# Patient Record
Sex: Female | Born: 1995 | Race: White | Hispanic: No | Marital: Single | Smoking: Never smoker
Health system: Southern US, Community
[De-identification: ages and names within clinical notes are randomized; demographics above are authoritative.]

## PROBLEM LIST (undated history)

## (undated) DIAGNOSIS — Q410 Congenital absence, atresia and stenosis of duodenum: Secondary | ICD-10-CM

## (undated) DIAGNOSIS — C73 Malignant neoplasm of thyroid gland: Secondary | ICD-10-CM

## (undated) HISTORY — PX: THYROIDECTOMY: SHX17

## (undated) HISTORY — PX: DUODENAL ATRESIA REPAIR: SHX1477

---

## 2021-01-08 ENCOUNTER — Inpatient Hospital Stay (HOSPITAL_COMMUNITY): Payer: BC Managed Care – PPO

## 2021-01-08 ENCOUNTER — Encounter (HOSPITAL_COMMUNITY): Payer: Self-pay | Admitting: Obstetrics & Gynecology

## 2021-01-08 ENCOUNTER — Inpatient Hospital Stay (HOSPITAL_COMMUNITY)
Admission: AD | Admit: 2021-01-08 | Discharge: 2021-01-08 | Disposition: A | Discharge status: Home / Self Care | Payer: BC Managed Care – PPO | Attending: Obstetrics & Gynecology | Admitting: Obstetrics & Gynecology

## 2021-01-08 ENCOUNTER — Other Ambulatory Visit: Payer: Self-pay

## 2021-01-08 DIAGNOSIS — M549 Dorsalgia, unspecified: Secondary | ICD-10-CM | POA: Diagnosis not present

## 2021-01-08 DIAGNOSIS — O209 Hemorrhage in early pregnancy, unspecified: Secondary | ICD-10-CM | POA: Insufficient documentation

## 2021-01-08 DIAGNOSIS — Z3A01 Less than 8 weeks gestation of pregnancy: Secondary | ICD-10-CM | POA: Diagnosis not present

## 2021-01-08 DIAGNOSIS — M545 Low back pain, unspecified: Secondary | ICD-10-CM

## 2021-01-08 DIAGNOSIS — O26891 Other specified pregnancy related conditions, first trimester: Secondary | ICD-10-CM | POA: Insufficient documentation

## 2021-01-08 DIAGNOSIS — R109 Unspecified abdominal pain: Secondary | ICD-10-CM | POA: Insufficient documentation

## 2021-01-08 DIAGNOSIS — O99891 Other specified diseases and conditions complicating pregnancy: Secondary | ICD-10-CM

## 2021-01-08 DIAGNOSIS — O4690 Antepartum hemorrhage, unspecified, unspecified trimester: Secondary | ICD-10-CM

## 2021-01-08 HISTORY — DX: Congenital absence, atresia and stenosis of duodenum: Q41.0

## 2021-01-08 HISTORY — DX: Malignant neoplasm of thyroid gland: C73

## 2021-01-08 LAB — URINALYSIS, MICROSCOPIC (REFLEX)

## 2021-01-08 LAB — CBC
HCT: 41.5 % (ref 36.0–46.0)
Hemoglobin: 13.9 g/dL (ref 12.0–15.0)
MCH: 31.9 pg (ref 26.0–34.0)
MCHC: 33.5 g/dL (ref 30.0–36.0)
MCV: 95.2 fL (ref 80.0–100.0)
Platelets: 255 10*3/uL (ref 150–400)
RBC: 4.36 MIL/uL (ref 3.87–5.11)
RDW: 14.5 % (ref 11.5–15.5)
WBC: 5.8 10*3/uL (ref 4.0–10.5)
nRBC: 0 % (ref 0.0–0.2)

## 2021-01-08 LAB — ABO/RH: ABO/RH(D): A POS

## 2021-01-08 LAB — URINALYSIS, ROUTINE W REFLEX MICROSCOPIC
Bilirubin Urine: NEGATIVE
Glucose, UA: NEGATIVE mg/dL
Ketones, ur: NEGATIVE mg/dL
Leukocytes,Ua: NEGATIVE
Nitrite: NEGATIVE
Protein, ur: NEGATIVE mg/dL
Specific Gravity, Urine: <1.005 — ABNORMAL LOW (ref 1.005–1.030)
pH: 6 (ref 5.0–8.0)

## 2021-01-08 LAB — HCG, QUANTITATIVE, PREGNANCY: hCG, Beta Chain, Quant, S: 9882 m[IU]/mL — ABNORMAL HIGH (ref <5)

## 2021-01-08 LAB — WET PREP, GENITAL
Sperm: NONE SEEN
Trich, Wet Prep: NONE SEEN
Yeast Wet Prep HPF POC: NONE SEEN

## 2021-01-08 LAB — POCT PREGNANCY, URINE: Preg Test, Ur: POSITIVE — AB

## 2021-01-08 MED ORDER — METRONIDAZOLE 0.75 % VA GEL: 1.0000 | Freq: Every day | VAGINAL | 0 refills | Status: AC

## 2021-01-08 NOTE — MAU Note (Signed)
Positive pregnancy test 2 weeks .   Abd feels large x 1 week all of sudden when compared to 2 weeks ago.  Cramps on and off and worse in lower back then abd.  Vaginal bleeding x 1 week.  Darker blood and more often during day but not using many pads.

## 2021-01-08 NOTE — MAU Provider Note (Signed)
History     CSN: [id number]  Arrival date and time: 01/08/21 0009   Event Date/Time   First Provider Initiated Contact with Patient 01/08/21 0151      Chief Complaint  Patient presents with   Vaginal Bleeding   Theresa Thompson is a 25 y.o. G1P0 at 6.4 weeks by Unsure LMP of November 23, 2020.  She presents today for Vaginal Bleeding.  She states she discovered she was pregnant about 2 weeks ago and has been having bleeding since that time.  She states it was light initially and has gotten progressively worse.  She also reports some back and abdominal pain.  She reports the back pain worsened about 2-3 days ago. She states she has passed some clots with urination that she describes as "bigger than a pea, but smaller than a grape."  She has not tried any medication for her pain or discomfort. She rates the pain a 6/10 with standing and reports it is worsened with laying down.  Patient states that the pain is improved with drawing up her knees, but that it is at it's worse when her legs are flat out. She denies sex in the past 3 days.    OB History     Gravida  1   Para      Term      Preterm      AB      Living         SAB      IAB      Ectopic      Multiple      Live Births              Past Medical History:  Diagnosis Date   Duodenal atresia    Thyroid cancer ([place])     Past Surgical History:  Procedure Laterality Date   DUODENAL ATRESIA REPAIR     THYROIDECTOMY      Family History  Problem Relation Age of Onset   Cancer Mother    Ovarian cancer Mother    Colon cancer Mother     Social History   Tobacco Use   Smoking status: Never   Smokeless tobacco: Never   Tobacco comments:    CBD PEN OCCAS USE FOR SLEEP  Vaping Use   Vaping Use: Some days   Substances: Nicotine  Substance Use Topics   Alcohol use: Not Currently   Drug use: Never    Allergies: No Known Allergies  Medications Prior to Admission  Medication Sig Dispense Refill Last  Dose   levothyroxine (SYNTHROID) 125 MCG tablet Take 125 mcg by mouth daily before breakfast.   01/07/2021    Review of Systems  Gastrointestinal:  Positive for abdominal pain and constipation. Negative for diarrhea, nausea and vomiting. Anal bleeding: Hard to pass, Last BM yesterday. Genitourinary:  Positive for vaginal bleeding and vaginal discharge (Prior to bleeding-white without odor.). Negative for difficulty urinating and dysuria.  Musculoskeletal:  Positive for back pain.  Neurological:  Negative for dizziness, light-headedness and headaches.  Physical Exam   Blood pressure 112/71, pulse 96, temperature 98.4 F (36.9 C), temperature source Oral, resp. rate 16, height '5\' 6"'$  (1.676 m), weight 92.1 kg, last menstrual period 11/24/2020, SpO2 96 %.  Physical Exam Vitals reviewed. Exam conducted with a chaperone present.  Constitutional:      Appearance: Normal appearance.  HENT:     Head: Normocephalic and atraumatic.  Eyes:     Conjunctiva/sclera: Conjunctivae normal.  Cardiovascular:  Rate and Rhythm: Normal rate and regular rhythm.  Abdominal:     General: Bowel sounds are normal.  Genitourinary:    Vagina: Vaginal discharge present. No tenderness.     Cervix: No cervical motion tenderness or discharge.     Uterus: Not enlarged and not tender.      Comments: Speculum Exam: -Normal External Genitalia: Non tender, no apparent discharge at introitus.  -Vaginal Vault: Pink mucosa with good rugae. Scant amt thin white malodorous discharge -wet prep collected -Cervix:Pink, no lesions, cysts, or polyps.  Appears closed. No active bleeding from os-GC/CT collected -Bimanual Exam:  Uterine size difficult to assess d/t body habitus.     Musculoskeletal:     Cervical back: Normal range of motion.  Skin:    General: Skin is warm and dry.  Neurological:     Mental Status: She is alert and oriented to person, place, and time.  Psychiatric:        Mood and Affect: Mood normal.         Behavior: Behavior normal.        Thought Content: Thought content normal.    MAU Course  Procedures Results for orders placed or performed during the hospital encounter of 01/08/21 (from the past 24 hour(s))  Urinalysis, Routine w reflex microscopic Urine, Clean Catch     Status: Abnormal   Collection Time: 01/08/21 12:31 AM  Result Value Ref Range   Color, Urine YELLOW YELLOW   APPearance CLEAR CLEAR   Specific Gravity, Urine <1.005 (L) 1.005 - 1.030   pH 6.0 5.0 - 8.0   Glucose, UA NEGATIVE NEGATIVE mg/dL   Hgb urine dipstick TRACE (A) NEGATIVE   Bilirubin Urine NEGATIVE NEGATIVE   Ketones, ur NEGATIVE NEGATIVE mg/dL   Protein, ur NEGATIVE NEGATIVE mg/dL   Nitrite NEGATIVE NEGATIVE   Leukocytes,Ua NEGATIVE NEGATIVE  Urinalysis, Microscopic (reflex)     Status: Abnormal   Collection Time: 01/08/21 12:31 AM  Result Value Ref Range   RBC / HPF 0-5 0 - 5 RBC/hpf   WBC, UA 0-5 0 - 5 WBC/hpf   Bacteria, UA RARE (A) NONE SEEN   Squamous Epithelial / LPF 0-5 0 - 5  Pregnancy, urine POC     Status: Abnormal   Collection Time: 01/08/21 12:34 AM  Result Value Ref Range   Preg Test, Ur POSITIVE (A) NEGATIVE  CBC     Status: None   Collection Time: 01/08/21  1:11 AM  Result Value Ref Range   WBC 5.8 4.0 - 10.5 K/uL   RBC 4.36 3.87 - 5.11 MIL/uL   Hemoglobin 13.9 12.0 - 15.0 g/dL   HCT 41.5 36.0 - 46.0 %   MCV 95.2 80.0 - 100.0 fL   MCH 31.9 26.0 - 34.0 pg   MCHC 33.5 30.0 - 36.0 g/dL   RDW 14.5 11.5 - 15.5 %   Platelets 255 150 - 400 K/uL   nRBC 0.0 0.0 - 0.2 %  hCG, quantitative, pregnancy     Status: Abnormal   Collection Time: 01/08/21  1:11 AM  Result Value Ref Range   hCG, Beta Chain, Quant, S 9,882 (H) <5 mIU/mL  ABO/Rh     Status: None   Collection Time: 01/08/21  1:11 AM  Result Value Ref Range   ABO/RH(D) A POS    No rh immune globuloin      NOT A RH IMMUNE GLOBULIN CANDIDATE, PT RH POSITIVE Performed at [facility], [address],  [place] [address]   Wet prep, genital     Status: Abnormal   Collection Time: 01/08/21  2:05 AM  Result Value Ref Range   Yeast Wet Prep HPF POC NONE SEEN NONE SEEN   Trich, Wet Prep NONE SEEN NONE SEEN   Clue Cells Wet Prep HPF POC PRESENT (A) NONE SEEN   WBC, Wet Prep HPF POC FEW (A) NONE SEEN   Sperm NONE SEEN    US OB LESS THAN 14 WEEKS WITH OB TRANSVAGINAL  Result Date: 01/08/2021 CLINICAL DATA:  Pregnant, spotting, cramping, beta HCG 9882 EXAM: OBSTETRIC <14 WK [place] AND TRANSVAGINAL OB US TECHNIQUE: Both transabdominal and transvaginal ultrasound examinations were performed for complete evaluation of the gestation as well as the maternal uterus, adnexal regions, and pelvic cul-de-sac. Transvaginal technique was performed to assess early pregnancy. COMPARISON:  None. FINDINGS: Intrauterine gestational sac: Single Yolk sac:  Visualized. Embryo:  Not Visualized. MSD: 6.6 mm   5 w   2 d Subchorionic hemorrhage:  None visualized. Maternal uterus/adnexae: Bilateral ovaries are within normal limits. No pelvic ascites. IMPRESSION: Single intrauterine gestational sac with yolk sac, measuring 5 weeks 2 days by mean sac diameter. No fetal pole is visualized, likely due to early gestation. Consider follow-up pelvic ultrasound in 14 days as clinically warranted. Electronically Signed   By: Julian Hy M.D.   On: 01/08/2021 02:44     MDM Pelvic Exam; Wet Prep and GC/CT Labs: UA, UPT, CBC, hCG, ABO Ultrasound Assessment and Plan  25 year old G1P0 at 6.4 weeks Abdominal Pain Back Pain Vaginal Bleeding  -POC Reviewed -Exam Performed and findings discussed.  -Cultures collected and pending. -Labs ordered. -Will send for [place] and await results.  Maryann Conners 01/08/2021, 1:51 AM   Reassessment (3:16 AM) IUGS + YS Bacterial Vaginosis  -Results as above. -Provider to bedside to discuss. -Patient informed of BV diagnosis and treatment. -Rx sent to pharmacy on file.  -Discussed US findings and  need for follow up. -Patient states she has obgyn in [place].  INstructed to call and get [place] scheduled accordingly. -Precautions given. -Encouraged to call primary office or return to MAU if symptoms worsen or with the onset of new symptoms. -Discharged to home in stable condition.  Maryann Conners MSN, CNM Advanced Practice Provider, Center for [employer] [facility]

## 2021-01-09 LAB — GC/CHLAMYDIA PROBE AMP (~~LOC~~) NOT AT ARMC
Chlamydia: POSITIVE — AB
Comment: NEGATIVE
Comment: NORMAL
Neisseria Gonorrhea: NEGATIVE

## 2021-01-11 ENCOUNTER — Telehealth: Payer: Self-pay

## 2021-01-11 NOTE — Telephone Encounter (Signed)
Theresa Thompson June 24, 1995  Attempted to contact patient. No answer.  HIPPA complaint message left and patient instructed to return call to MAU by [place] MSN, [place] Provider, Center for [facility]

## 2021-01-12 ENCOUNTER — Other Ambulatory Visit: Payer: Self-pay

## 2021-01-12 DIAGNOSIS — A749 Chlamydial infection, unspecified: Secondary | ICD-10-CM

## 2021-01-12 MED ORDER — AZITHROMYCIN 500 MG PO TABS: 1000.0000 mg | ORAL_TABLET | Freq: Once | ORAL | 0 refills | Status: AC

## 2022-07-23 ENCOUNTER — Other Ambulatory Visit: Payer: Self-pay

## 2022-07-23 ENCOUNTER — Emergency Department (HOSPITAL_BASED_OUTPATIENT_CLINIC_OR_DEPARTMENT_OTHER): Payer: Medicaid Other

## 2022-07-23 ENCOUNTER — Encounter (HOSPITAL_BASED_OUTPATIENT_CLINIC_OR_DEPARTMENT_OTHER): Payer: Self-pay | Admitting: *Deleted

## 2022-07-23 ENCOUNTER — Emergency Department (HOSPITAL_BASED_OUTPATIENT_CLINIC_OR_DEPARTMENT_OTHER)
Admission: EM | Admit: 2022-07-23 | Discharge: 2022-07-24 | Disposition: A | Discharge status: Home / Self Care | Payer: Medicaid Other | Attending: Emergency Medicine | Admitting: Emergency Medicine

## 2022-07-23 DIAGNOSIS — Z85850 Personal history of malignant neoplasm of thyroid: Secondary | ICD-10-CM | POA: Insufficient documentation

## 2022-07-23 DIAGNOSIS — N179 Acute kidney failure, unspecified: Secondary | ICD-10-CM

## 2022-07-23 DIAGNOSIS — K219 Gastro-esophageal reflux disease without esophagitis: Secondary | ICD-10-CM

## 2022-07-23 DIAGNOSIS — R079 Chest pain, unspecified: Secondary | ICD-10-CM | POA: Diagnosis present

## 2022-07-23 LAB — LIPASE, BLOOD: Lipase: 44 U/L (ref 11–51)

## 2022-07-23 LAB — HEPATIC FUNCTION PANEL
ALT: 23 U/L (ref 0–44)
AST: 37 U/L (ref 15–41)
Albumin: 4.2 g/dL (ref 3.5–5.0)
Alkaline Phosphatase: 80 U/L (ref 38–126)
Bilirubin, Direct: 0.3 mg/dL — ABNORMAL HIGH (ref 0.0–0.2)
Indirect Bilirubin: 0.6 mg/dL (ref 0.3–0.9)
Total Bilirubin: 0.9 mg/dL (ref 0.3–1.2)
Total Protein: 8.3 g/dL — ABNORMAL HIGH (ref 6.5–8.1)

## 2022-07-23 LAB — CBC
HCT: 41 % (ref 36.0–46.0)
Hemoglobin: 13.8 g/dL (ref 12.0–15.0)
MCH: 33.6 pg (ref 26.0–34.0)
MCHC: 33.7 g/dL (ref 30.0–36.0)
MCV: 99.8 fL (ref 80.0–100.0)
Platelets: 225 10*3/uL (ref 150–400)
RBC: 4.11 MIL/uL (ref 3.87–5.11)
RDW: 14.8 % (ref 11.5–15.5)
WBC: 6.4 10*3/uL (ref 4.0–10.5)
nRBC: 0 % (ref 0.0–0.2)

## 2022-07-23 LAB — BASIC METABOLIC PANEL
Anion gap: 10 (ref 5–15)
BUN: 13 mg/dL (ref 6–20)
CO2: 23 mmol/L (ref 22–32)
Calcium: 8.2 mg/dL — ABNORMAL LOW (ref 8.9–10.3)
Chloride: 103 mmol/L (ref 98–111)
Creatinine, Ser: 1.31 mg/dL — ABNORMAL HIGH (ref 0.44–1.00)
GFR, Estimated: 58 mL/min — ABNORMAL LOW (ref >60)
Glucose, Bld: 84 mg/dL (ref 70–99)
Potassium: 3.9 mmol/L (ref 3.5–5.1)
Sodium: 136 mmol/L (ref 135–145)

## 2022-07-23 LAB — URINALYSIS, ROUTINE W REFLEX MICROSCOPIC
Bilirubin Urine: NEGATIVE
Glucose, UA: NEGATIVE mg/dL
Hgb urine dipstick: NEGATIVE
Ketones, ur: NEGATIVE mg/dL
Leukocytes,Ua: NEGATIVE
Nitrite: NEGATIVE
Protein, ur: NEGATIVE mg/dL
Specific Gravity, Urine: 1.01 (ref 1.005–1.030)
pH: 5.5 (ref 5.0–8.0)

## 2022-07-23 LAB — TROPONIN I (HIGH SENSITIVITY)
Troponin I (High Sensitivity): <2 ng/L (ref <18)
Troponin I (High Sensitivity): <2 ng/L (ref <18)

## 2022-07-23 LAB — PREGNANCY, URINE: Preg Test, Ur: NEGATIVE

## 2022-07-23 MED ORDER — LACTATED RINGERS IV BOLUS
1000.0000 mL | Freq: Once | INTRAVENOUS | Status: AC
  Administered 2022-07-23: 1000 mL via INTRAVENOUS

## 2022-07-23 NOTE — ED Triage Notes (Addendum)
Pt has been having mid to upper back pain as well as crushing chest pain which increases with swallowing as well as after eating and she sometimes has a difficult time swallowing food.  Pt reports a severe indigestion like pain. Pt reports that she has also has had right shoulder pain.  Pt reports that this has been going on for 10 months since she gave birth but today the symptoms were so bad that she could not stand it.  No SOB with this.

## 2022-07-23 NOTE — ED Notes (Signed)
Patient transported to X-ray 

## 2022-07-24 MED ORDER — SUCRALFATE 1 G PO TABS: 1.0000 g | ORAL_TABLET | Freq: Three times a day (TID) | ORAL | 0 refills | Status: AC

## 2022-07-24 NOTE — ED Provider Notes (Incomplete)
[place] EMERGENCY DE[facility]MENT [facility] [place] Provider Note   CSN: [id number] Arrival date & time: 07/23/22  2126     History Chief Complaint  Patient presents with  . Back Pain  . Chest Pain    Theresa Thompson is a 27 y.o. female with h/o duodenal atresia and thyroid cancer s/p thyroidectomy presents to the ER for evaluation of chest pain radiating to the upper back for the past 5 months. The patient reports that this happens after eating foods and fluids, but does happen without.   Back Pain Associated symptoms: chest pain   Chest Pain Associated symptoms: back pain        Home Medications Prior to Admission medications   Medication Sig Start Date End Date Taking? Authorizing Provider  levothyroxine (SYNTHROID) 125 MCG tablet Take 125 mcg by mouth daily before breakfast.    [provider]  metroNIDAZOLE (METROGEL VAGINAL) 0.75 % vaginal gel Place 1 Applicatorful vaginally at bedtime. Insert one applicator, at bedtime, for 5 nights. 01/08/21   Gavin Pound, CNM      Allergies    Patient has no known allergies.    Review of Systems   Review of Systems  Cardiovascular:  Positive for chest pain.  Musculoskeletal:  Positive for back pain.    Physical Exam Updated Vital Signs BP (!) 92/58   Pulse 76   Temp 97.7 F (36.5 C) (Oral)   Resp (!) 23   Wt 92.1 kg   LMP 06/27/2022 (Approximate)   SpO2 98%   Breastfeeding No   BMI 32.77 kg/m  Physical Exam  ED Results / Procedures / Treatments   Labs (all labs ordered are listed, but only abnormal results are displayed) Labs Reviewed  BASIC METABOLIC PANEL - Abnormal; Notable for the following components:      Result Value   Creatinine, Ser 1.31 (*)    Calcium 8.2 (*)    GFR, Estimated 58 (*)    All other components within normal limits  HEPATIC FUNCTION PANEL - Abnormal; Notable for the following components:   Total Protein 8.3 (*)    Bilirubin, Direct 0.3 (*)    All other components  within normal limits  CBC  PREGNANCY, URINE  LIPASE, BLOOD  URINALYSIS, ROUTINE W REFLEX MICROSCOPIC  TROPONIN I (HIGH SENSITIVITY)  TROPONIN I (HIGH SENSITIVITY)    EKG None  Radiology DG Chest 2 View  Result Date: 07/23/2022 CLINICAL DATA:  Upper back and chest pain.  Right shoulder pain. EXAM: CHEST - 2 VIEW COMPARISON:  11/12/2017. FINDINGS: The heart size and mediastinal contours are within normal limits. Both lungs are clear. No acute osseous abnormality. IMPRESSION: No active cardiopulmonary disease. Electronically Signed   By: Brett Fairy M.D.   On: 07/23/2022 21:59    Procedures Procedures   Medications Ordered in ED Medications  lactated ringers bolus 1,000 mL (1,000 mLs Intravenous New Bag/Given 07/23/22 2319)    ED Course/ Medical Decision Making/ A&P                           Medical Decision Making Amount and/or Complexity of Data Reviewed Labs: ordered. Radiology: ordered.   ***  27 y.o. female presents to the ER for evaluation of ACS, pericarditis, myocarditis, aortic dissection, PE, pneumothorax, esophageal spasm or rupture, chronic angina, pneumonia, bronchitis, GERD, reflux/PUD, biliary disease, pancreatitis, costochondritis, anxiety, esophageal stricture. Differential diagnosis includes but is not limited to ***. Vital signs ***. Physical exam as  noted above.   I independently reviewed and interpreted the patient's labs. ***.  After consideration of the diagnostic results and the patients response to treatment, I feel that *** .   Emergency department workup does*** not suggest an emergent condition requiring admission or immediate intervention beyond what has been performed at this time.   ***We discussed the results of the labs/imaging. The plan is ***. We discussed strict return precautions and red flag symptoms. The patient verbalized their understanding and agrees to the plan. The patient is stable and being discharged home in good  condition.  ***Portions of this report may have been transcribed using voice recognition software. Every effort was made to ensure accuracy; however, inadvertent computerized transcription errors may be present.   Final Clinical Impression(s) / ED Diagnoses Final diagnoses:  None    Rx / DC Orders ED Discharge Orders     None

## 2022-07-24 NOTE — ED Provider Notes (Signed)
[place] EMERGENCY DEPARTMENT AT [place] Provider Note   CSN: [id number] Arrival date & time: 07/23/22  2126     History Chief Complaint  Patient presents with   Back Pain   Chest Pain    Theresa Thompson is a 27 y.o. female with h/o duodenal atresia and thyroid cancer s/p thyroidectomy presents to the ER for evaluation of chest pain radiating to the upper back for the past 5 months. The patient reports that this happens after eating foods and fluids, but does happen without.   Back Pain Associated symptoms: chest pain   Chest Pain Associated symptoms: back pain        Home Medications Prior to Admission medications   Medication Sig Start Date End Date Taking? Authorizing Provider  levothyroxine (SYNTHROID) 125 MCG tablet Take 125 mcg by mouth daily before breakfast.    [provider]  metroNIDAZOLE (METROGEL VAGINAL) 0.75 % vaginal gel Place 1 Applicatorful vaginally at bedtime. Insert one applicator, at bedtime, for 5 nights. 01/08/21   Gavin Pound, CNM      Allergies    Patient has no known allergies.    Review of Systems   Review of Systems  Cardiovascular:  Positive for chest pain.  Musculoskeletal:  Positive for back pain.    Physical Exam Updated Vital Signs BP (!) 92/58   Pulse 76   Temp 97.7 F (36.5 C) (Oral)   Resp (!) 23   Wt 92.1 kg   LMP 06/27/2022 (Approximate)   SpO2 98%   Breastfeeding No   BMI 32.77 kg/m  Physical Exam  ED Results / Procedures / Treatments   Labs (all labs ordered are listed, but only abnormal results are displayed) Labs Reviewed  BASIC METABOLIC PANEL - Abnormal; Notable for the following components:      Result Value   Creatinine, Ser 1.31 (*)    Calcium 8.2 (*)    GFR, Estimated 58 (*)    All other components within normal limits  HEPATIC FUNCTION PANEL - Abnormal; Notable for the following components:   Total Protein 8.3 (*)    Bilirubin, Direct 0.3 (*)    All other components  within normal limits  CBC  PREGNANCY, URINE  LIPASE, BLOOD  URINALYSIS, ROUTINE W REFLEX MICROSCOPIC  TROPONIN I (HIGH SENSITIVITY)  TROPONIN I (HIGH SENSITIVITY)    EKG None  Radiology DG Chest 2 View  Result Date: 07/23/2022 CLINICAL DATA:  Upper back and chest pain.  Right shoulder pain. EXAM: CHEST - 2 VIEW COMPARISON:  11/12/2017. FINDINGS: The heart size and mediastinal contours are within normal limits. Both lungs are clear. No acute osseous abnormality. IMPRESSION: No active cardiopulmonary disease. Electronically Signed   By: Brett Fairy M.D.   On: 07/23/2022 21:59    Procedures Procedures   Medications Ordered in ED Medications  lactated ringers bolus 1,000 mL (1,000 mLs Intravenous New Bag/Given 07/23/22 2319)    ED Course/ Medical Decision Making/ A&P                           Medical Decision Making Amount and/or Complexity of Data Reviewed Labs: ordered. Radiology: ordered.   ***  27 y.o. female presents to the ER for evaluation of ACS, pericarditis, myocarditis, aortic dissection, PE, pneumothorax, esophageal spasm or rupture, chronic angina, pneumonia, bronchitis, GERD, reflux/PUD, biliary disease, pancreatitis, costochondritis, anxiety, esophageal stricture. Differential diagnosis includes but is not limited to ***. Vital signs ***. Physical exam as  noted above.   I independently reviewed and interpreted the patient's labs. ***.  After consideration of the diagnostic results and the patients response to treatment, I feel that *** .   Emergency department workup does*** not suggest an emergent condition requiring admission or immediate intervention beyond what has been performed at this time.   ***We discussed the results of the labs/imaging. The plan is ***. We discussed strict return precautions and red flag symptoms. The patient verbalized their understanding and agrees to the plan. The patient is stable and being discharged home in good  condition.  ***Portions of this report may have been transcribed using voice recognition software. Every effort was made to ensure accuracy; however, inadvertent computerized transcription errors may be present.   Final Clinical Impression(s) / ED Diagnoses Final diagnoses:  None    Rx / DC Orders ED Discharge Orders     None

## 2022-07-24 NOTE — Discharge Instructions (Addendum)
You were seen in the ER today for evaluation of your acid reflux. I am starting you on a medication called Carafate. You will need to take this three times a day with meals. I have included the information for Dr. Lorie Apley office. Please make sure you call them tomorrow to schedule appointment. You will likely need additional imaging. Please make sure you are drinking fluids because your kidney function was elevated. If you have any concerns, new or worsening symptoms, please return to the nearest ER for re-evaluation.   Contact a health care provider if: You have: New symptoms. Unexplained weight loss. Difficulty swallowing or it hurts to swallow. Wheezing or a persistent cough. A hoarse voice. Your symptoms do not improve with treatment. Get help right away if: You have sudden pain in your arms, neck, jaw, teeth, or back. You suddenly feel sweaty, dizzy, or light-headed. You have chest pain or shortness of breath. You vomit and the vomit is green, yellow, or black, or it looks like blood or coffee grounds. You faint. You have stool that is red, bloody, or black. You cannot swallow, drink, or eat. These symptoms may represent a serious problem that is an emergency. Do not wait to see if the symptoms will go away. Get medical help right away. Call your local emergency services (911 in the [place] Do not drive yourself to the hospital.

## 2022-10-27 IMAGING — US US OB < 14 WEEKS - US OB TV
1 series · 15 of 28 positions shown · non-contrast
Comparison: None.

CLINICAL DATA: Pregnant, spotting, cramping, beta HCG 1005

EXAM:
OBSTETRIC <14 WK US AND TRANSVAGINAL OB US
TECHNIQUE: Both transabdominal and transvaginal ultrasound examinations were
performed for complete evaluation of the gestation as well as the
maternal uterus, adnexal regions, and pelvic cul-de-sac.
Transvaginal technique was performed to assess early pregnancy.

[Series 1: us ob < 14 weeks - us ob tv · 15 of 33 slices shown]
[im 1/33]
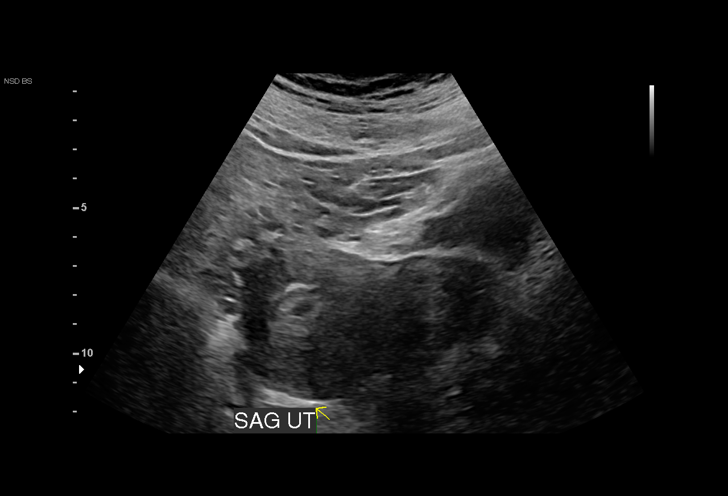
[im 3/33]
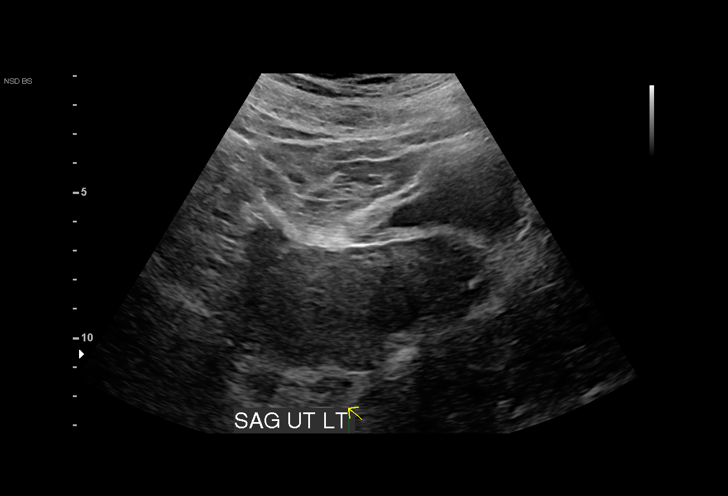
[im 5/33]
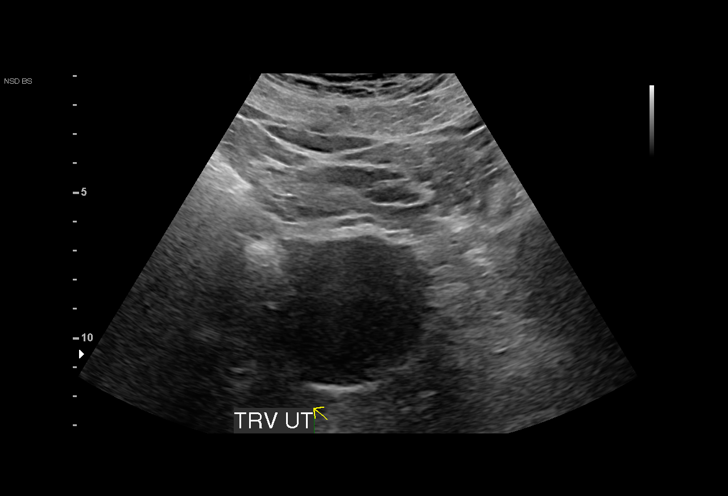
[im 8/33]
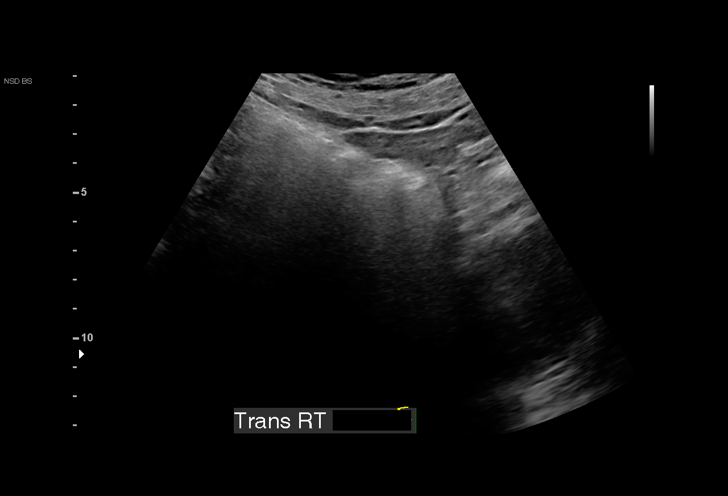
[im 10/33]
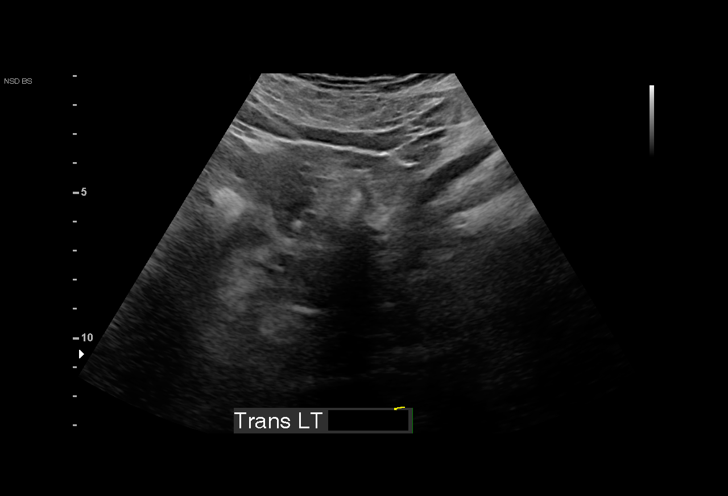
[im 12/33]
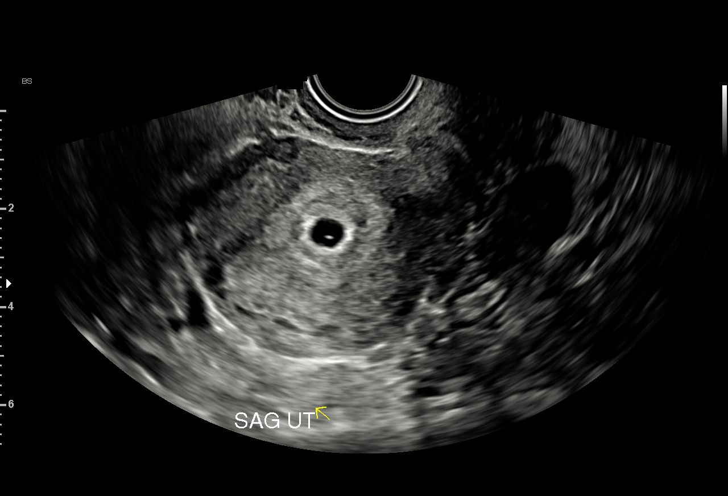
[im 15/33]
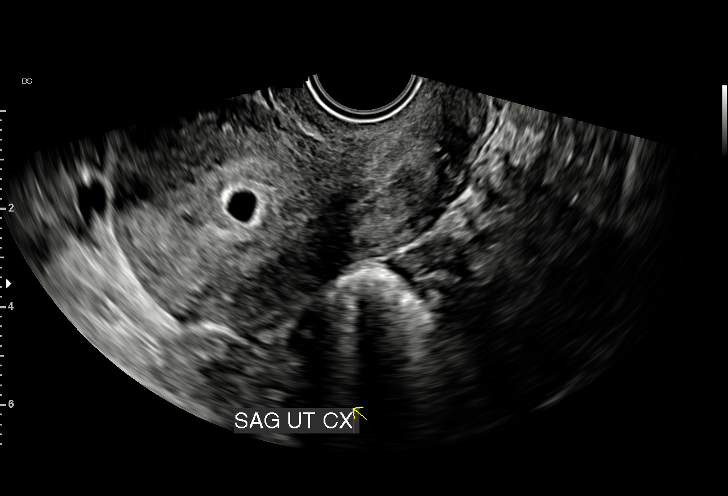
[im 17/33]
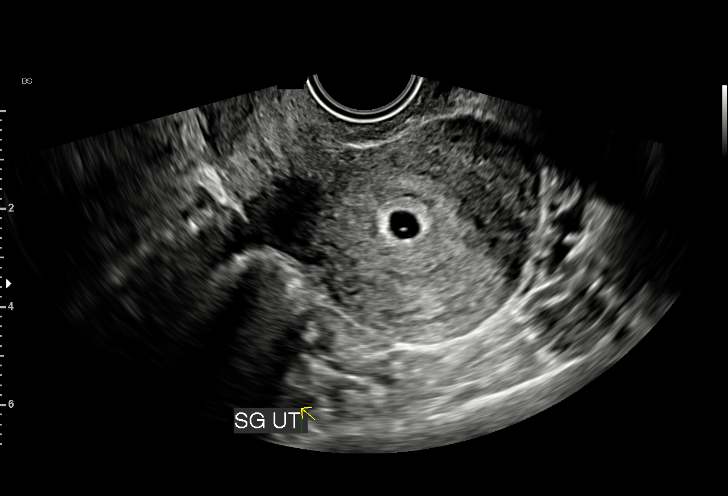
[im 18/33]
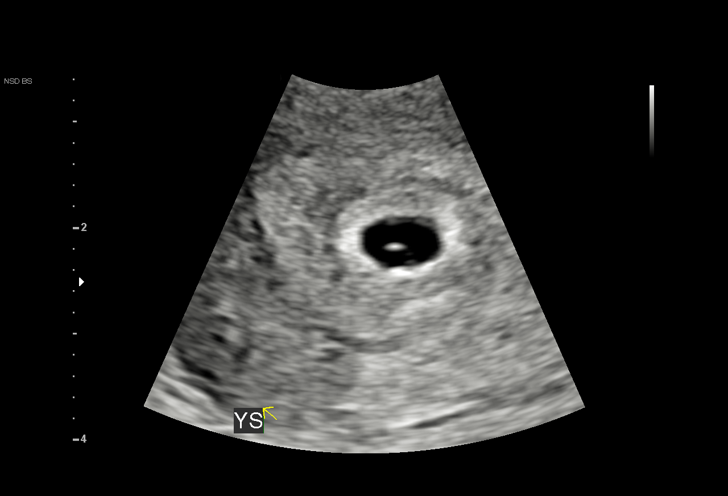
[im 21/33]
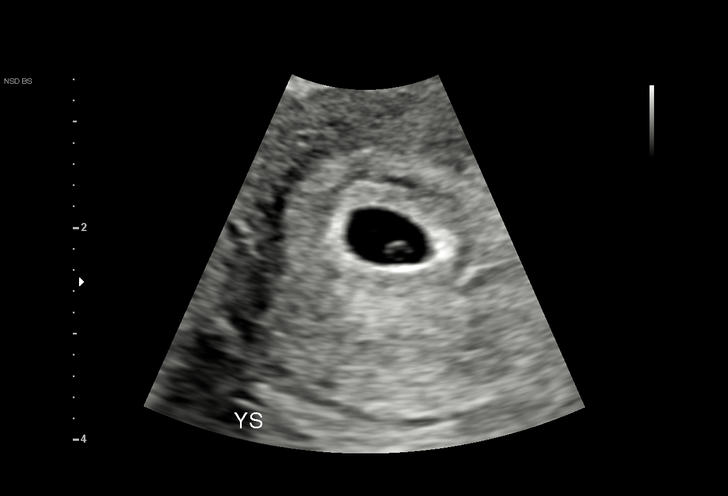
[im 23/33]
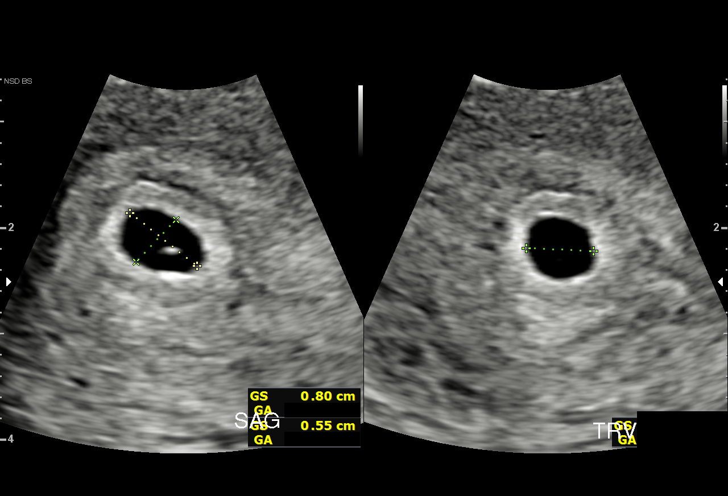
[im 25/33]
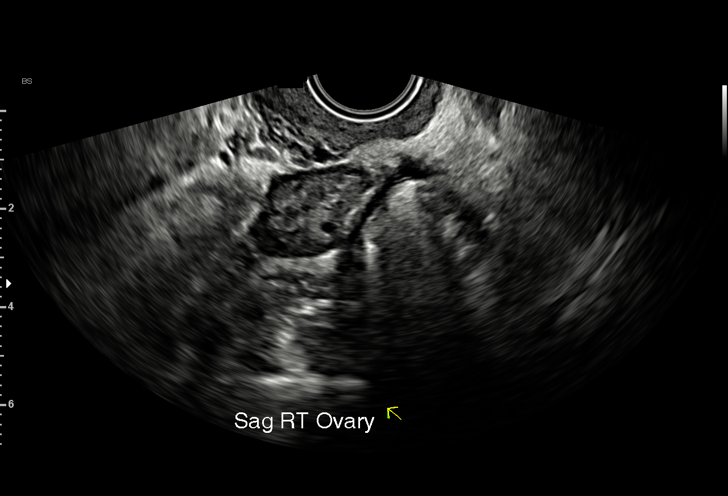
[im 28/33]
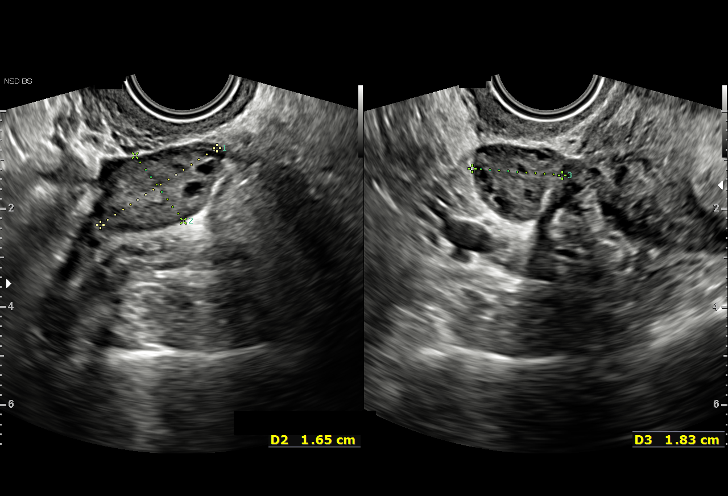
[im 30/33]
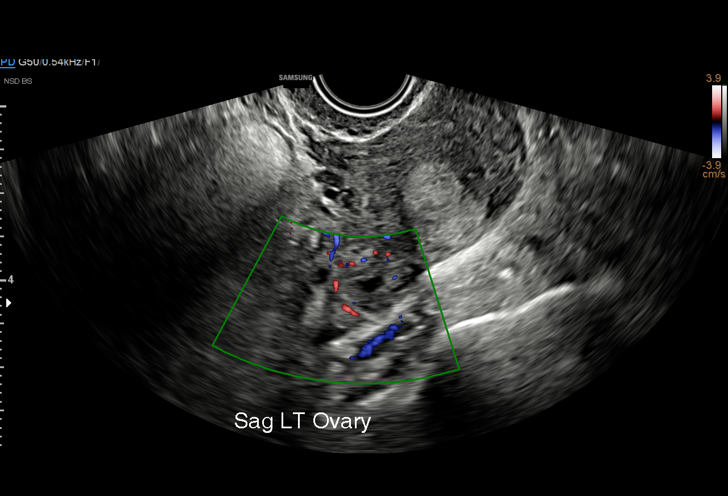
[im 33/33]
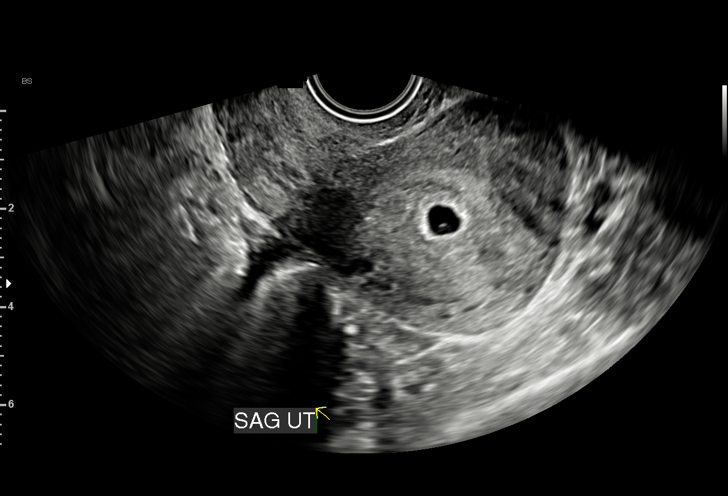

[15 of 28 positions shown; findings below may reference images not displayed]

FINDINGS: Intrauterine gestational sac: Single

Yolk sac:  Visualized.

Embryo:  Not Visualized.

MSD: 6.6 mm   5 w   2 d

Subchorionic hemorrhage:  None visualized.

Maternal uterus/adnexae: Bilateral ovaries are within normal limits.

No pelvic ascites.
IMPRESSION: Single intrauterine gestational sac with yolk sac, measuring 5 weeks
2 days by mean sac diameter. No fetal pole is visualized, likely due
to early gestation.

Consider follow-up pelvic ultrasound in 14 days as clinically
warranted.
